# Patient Record
Sex: Female | Born: 1980
Health system: Southern US, Community
[De-identification: ages and names within clinical notes are randomized; demographics above are authoritative.]

## PROBLEM LIST (undated history)

## (undated) DIAGNOSIS — F988 Other specified behavioral and emotional disorders with onset usually occurring in childhood and adolescence: Secondary | ICD-10-CM

## (undated) DIAGNOSIS — R202 Paresthesia of skin: Secondary | ICD-10-CM

## (undated) DIAGNOSIS — E782 Mixed hyperlipidemia: Secondary | ICD-10-CM

## (undated) DIAGNOSIS — K219 Gastro-esophageal reflux disease without esophagitis: Secondary | ICD-10-CM

## (undated) DIAGNOSIS — M5442 Lumbago with sciatica, left side: Secondary | ICD-10-CM

## (undated) HISTORY — PX: WISDOM TOOTH EXTRACTION: SHX21

---
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
## 2000-01-17 ENCOUNTER — Inpatient Hospital Stay (HOSPITAL_COMMUNITY): Admission: AD | Admit: 2000-01-17 | Discharge: 2000-01-17 | Payer: Self-pay | Admitting: *Deleted

## 2000-03-08 ENCOUNTER — Inpatient Hospital Stay (HOSPITAL_COMMUNITY): Admission: AD | Admit: 2000-03-08 | Discharge: 2000-03-08 | Payer: Self-pay | Admitting: Obstetrics & Gynecology

## 2000-03-11 ENCOUNTER — Inpatient Hospital Stay (HOSPITAL_COMMUNITY): Admission: AD | Admit: 2000-03-11 | Discharge: 2000-03-11 | Payer: Self-pay | Admitting: Obstetrics & Gynecology

## 2000-03-18 ENCOUNTER — Inpatient Hospital Stay (HOSPITAL_COMMUNITY): Admission: AD | Admit: 2000-03-18 | Discharge: 2000-03-22 | Payer: Self-pay | Admitting: *Deleted

## 2000-11-23 ENCOUNTER — Encounter: Payer: Self-pay | Admitting: Emergency Medicine

## 2000-11-23 ENCOUNTER — Emergency Department (HOSPITAL_COMMUNITY): Admission: EM | Admit: 2000-11-23 | Discharge: 2000-11-23 | Payer: Self-pay | Admitting: Emergency Medicine

## 2000-11-25 ENCOUNTER — Emergency Department (HOSPITAL_COMMUNITY): Admission: EM | Admit: 2000-11-25 | Discharge: 2000-11-25 | Payer: Self-pay | Admitting: Emergency Medicine

## 2002-02-28 ENCOUNTER — Emergency Department (HOSPITAL_COMMUNITY): Admission: EM | Admit: 2002-02-28 | Discharge: 2002-02-28 | Payer: Self-pay | Admitting: Emergency Medicine

## 2002-02-28 ENCOUNTER — Encounter: Payer: Self-pay | Admitting: Emergency Medicine

## 2002-07-01 ENCOUNTER — Encounter: Admission: RE | Admit: 2002-07-01 | Discharge: 2002-07-01 | Payer: Self-pay | Admitting: *Deleted

## 2002-07-01 ENCOUNTER — Encounter: Payer: Self-pay | Admitting: *Deleted

## 2005-06-13 ENCOUNTER — Other Ambulatory Visit: Admission: RE | Admit: 2005-06-13 | Discharge: 2005-06-13 | Payer: Self-pay | Admitting: Internal Medicine

## 2006-07-05 ENCOUNTER — Other Ambulatory Visit: Admission: RE | Admit: 2006-07-05 | Discharge: 2006-07-05 | Payer: Self-pay | Admitting: *Deleted

## 2007-08-29 ENCOUNTER — Other Ambulatory Visit: Admission: RE | Admit: 2007-08-29 | Discharge: 2007-08-29 | Payer: Self-pay | Admitting: Family Medicine

## 2007-08-29 ENCOUNTER — Encounter: Admission: RE | Admit: 2007-08-29 | Discharge: 2007-08-29 | Payer: Self-pay | Admitting: Family Medicine

## 2009-04-14 ENCOUNTER — Encounter: Admission: RE | Admit: 2009-04-14 | Discharge: 2009-04-14 | Payer: Self-pay | Admitting: Obstetrics and Gynecology

## 2009-05-05 ENCOUNTER — Inpatient Hospital Stay (HOSPITAL_COMMUNITY): Admission: AD | Admit: 2009-05-05 | Discharge: 2009-05-05 | Payer: Self-pay | Admitting: Obstetrics and Gynecology

## 2009-05-08 ENCOUNTER — Inpatient Hospital Stay (HOSPITAL_COMMUNITY): Admission: AD | Admit: 2009-05-08 | Discharge: 2009-05-08 | Payer: Self-pay | Admitting: Obstetrics & Gynecology

## 2009-05-20 ENCOUNTER — Inpatient Hospital Stay (HOSPITAL_COMMUNITY): Admission: AD | Admit: 2009-05-20 | Discharge: 2009-05-21 | Payer: Self-pay | Admitting: Obstetrics and Gynecology

## 2009-05-24 ENCOUNTER — Inpatient Hospital Stay (HOSPITAL_COMMUNITY): Admission: RE | Admit: 2009-05-24 | Discharge: 2009-05-26 | Payer: Self-pay | Admitting: Obstetrics and Gynecology

## 2010-06-09 ENCOUNTER — Other Ambulatory Visit: Admission: RE | Admit: 2010-06-09 | Discharge: 2010-06-09 | Payer: Self-pay | Admitting: Family Medicine

## 2010-10-25 LAB — CBC
HCT: 32.2 % — ABNORMAL LOW (ref 36.0–46.0)
HCT: 36 % (ref 36.0–46.0)
Hemoglobin: 10.8 g/dL — ABNORMAL LOW (ref 12.0–15.0)
Hemoglobin: 12.1 g/dL (ref 12.0–15.0)
MCHC: 33.5 g/dL (ref 30.0–36.0)
MCHC: 33.7 g/dL (ref 30.0–36.0)
MCV: 91 fL (ref 78.0–100.0)
MCV: 91.3 fL (ref 78.0–100.0)
Platelets: 195 10*3/uL (ref 150–400)
Platelets: 238 10*3/uL (ref 150–400)
RBC: 3.52 MIL/uL — ABNORMAL LOW (ref 3.87–5.11)
RBC: 3.96 MIL/uL (ref 3.87–5.11)
RDW: 14.3 % (ref 11.5–15.5)
RDW: 14.6 % (ref 11.5–15.5)
WBC: 13.9 10*3/uL — ABNORMAL HIGH (ref 4.0–10.5)
WBC: 14 10*3/uL — ABNORMAL HIGH (ref 4.0–10.5)

## 2010-10-25 LAB — GLUCOSE, CAPILLARY: Glucose-Capillary: 90 mg/dL (ref 70–99)

## 2010-10-25 LAB — RPR: RPR Ser Ql: NONREACTIVE

## 2010-10-26 LAB — GLUCOSE, CAPILLARY: Glucose-Capillary: 88 mg/dL (ref 70–99)

## 2013-03-09 ENCOUNTER — Other Ambulatory Visit: Payer: Self-pay

## 2013-03-09 DIAGNOSIS — Z1231 Encounter for screening mammogram for malignant neoplasm of breast: Secondary | ICD-10-CM

## 2013-04-20 ENCOUNTER — Ambulatory Visit
Admission: RE | Admit: 2013-04-20 | Discharge: 2013-04-20 | Disposition: A | Discharge status: Home / Self Care | Payer: 59 | Source: Ambulatory Visit

## 2013-04-20 DIAGNOSIS — Z1231 Encounter for screening mammogram for malignant neoplasm of breast: Secondary | ICD-10-CM

## 2013-05-15 ENCOUNTER — Other Ambulatory Visit: Payer: Self-pay | Admitting: Family Medicine

## 2013-05-15 ENCOUNTER — Other Ambulatory Visit (HOSPITAL_COMMUNITY)
Admission: RE | Admit: 2013-05-15 | Discharge: 2013-05-15 | Disposition: A | Discharge status: Home / Self Care | Payer: 59 | Source: Ambulatory Visit | Attending: Family Medicine | Admitting: Family Medicine

## 2013-05-15 DIAGNOSIS — Z0000 Encounter for general adult medical examination without abnormal findings: Secondary | ICD-10-CM | POA: Insufficient documentation

## 2014-12-16 ENCOUNTER — Emergency Department (HOSPITAL_COMMUNITY)
Admission: EM | Admit: 2014-12-16 | Discharge: 2014-12-16 | Disposition: A | Discharge status: Home / Self Care | Payer: 59 | Attending: Emergency Medicine | Admitting: Emergency Medicine

## 2014-12-16 ENCOUNTER — Encounter (HOSPITAL_COMMUNITY): Payer: Self-pay | Admitting: Emergency Medicine

## 2014-12-16 DIAGNOSIS — S91031A Puncture wound without foreign body, right ankle, initial encounter: Secondary | ICD-10-CM | POA: Insufficient documentation

## 2014-12-16 DIAGNOSIS — W228XXA Striking against or struck by other objects, initial encounter: Secondary | ICD-10-CM | POA: Insufficient documentation

## 2014-12-16 DIAGNOSIS — S81812A Laceration without foreign body, left lower leg, initial encounter: Secondary | ICD-10-CM | POA: Diagnosis present

## 2014-12-16 DIAGNOSIS — Y9289 Other specified places as the place of occurrence of the external cause: Secondary | ICD-10-CM | POA: Insufficient documentation

## 2014-12-16 DIAGNOSIS — Y998 Other external cause status: Secondary | ICD-10-CM | POA: Diagnosis not present

## 2014-12-16 DIAGNOSIS — Y9389 Activity, other specified: Secondary | ICD-10-CM | POA: Insufficient documentation

## 2014-12-16 MED ORDER — CEPHALEXIN 500 MG PO CAPS: 500.0000 mg | ORAL_CAPSULE | Freq: Four times a day (QID) | ORAL | Status: DC

## 2014-12-16 NOTE — ED Notes (Signed)
Pt states she was mowing and something flew up and punctured her right lower leg. Very small wound to right anterior shin, bandaged.

## 2014-12-16 NOTE — Discharge Instructions (Signed)
Puncture Wound °A puncture wound is an injury that extends through all layers of the skin and into the tissue beneath the skin (subcutaneous tissue). Puncture wounds become infected easily because germs often enter the body and go beneath the skin during the injury. Having a deep wound with a small entrance point makes it difficult for your caregiver to adequately clean the wound. This is especially true if you have stepped on a nail and it has passed through a dirty shoe or other situations where the wound is obviously contaminated. °CAUSES  °Many puncture wounds involve glass, nails, splinters, fish hooks, or other objects that enter the skin (foreign bodies). A puncture wound may also be caused by a human bite or animal bite. °DIAGNOSIS  °A puncture wound is usually diagnosed by your history and a physical exam. You may need to have an X-ray or an ultrasound to check for any foreign bodies still in the wound. °TREATMENT  °· Your caregiver will clean the wound as thoroughly as possible. Depending on the location of the wound, a bandage (dressing) may be applied. °· Your caregiver might prescribe antibiotic medicines. °· You may need a follow-up visit to check on your wound. Follow all instructions as directed by your caregiver. °HOME CARE INSTRUCTIONS  °· Change your dressing once per day, or as directed by your caregiver. If the dressing sticks, it may be removed by soaking the area in water. °· If your caregiver has given you follow-up instructions, it is very important that you return for a follow-up appointment. Not following up as directed could result in a chronic or permanent injury, pain, and disability. °· Only take over-the-counter or prescription medicines for pain, discomfort, or fever as directed by your caregiver. °· If you are given antibiotics, take them as directed. Finish them even if you start to feel better. °You may need a tetanus shot if: °· You cannot remember when you had your last tetanus  shot. °· You have never had a tetanus shot. °If you got a tetanus shot, your arm may swell, get red, and feel warm to the touch. This is common and not a problem. If you need a tetanus shot and you choose not to have one, there is a rare chance of getting tetanus. Sickness from tetanus can be serious. °You may need a rabies shot if an animal bite caused your puncture wound. °SEEK MEDICAL CARE IF:  °· You have redness, swelling, or increasing pain in the wound. °· You have red streaks going away from the wound. °· You notice a bad smell coming from the wound or dressing. °· You have yellowish-white fluid (pus) coming from the wound. °· You are treated with an antibiotic for infection, but the infection is not getting better. °· You notice something in the wound, such as rubber from your shoe, cloth, or another object. °· You have a fever. °· You have severe pain. °· You have difficulty breathing. °· You feel dizzy or faint. °· You cannot stop vomiting. °· You lose feeling, develop numbness, or cannot move a limb below the wound. °· Your symptoms worsen. °MAKE SURE YOU: °· Understand these instructions. °· Will watch your condition. °· Will get help right away if you are not doing well or get worse. °Document Released: 04/18/2005 Document Revised: 10/01/2011 Document Reviewed: 12/26/2010 °[employer]® Patient Information ©2015 [employer]. This information is not intended to replace advice given to you by your health care provider. Make sure you discuss any questions you   have with your health care provider. ° °

## 2014-12-16 NOTE — ED Provider Notes (Signed)
CSN: [id number]     Arrival date & time 12/16/14  2052 History  This chart was scribed for Sheena Madura, PA-C working with Layla Maw Ward, DO by Elveria Rising, ED Scribe. This patient was seen in room WOTF/NONE and the patient's care was started at 9:14 PM.   Chief Complaint  Patient presents with  . Extremity Laceration   The history is provided by the patient. No language interpreter was used.   HPI Comments: Sheena Dominguez is a 34 y.o. female who presents to the Emergency Department with left lower leg injury incurred one hour ago while cutting the grass tonight. Patient reports being struck by unknown projectile that shot from the mower. Patient suspects it was rock; states she was moving beneath a bush. Patient presents with bleeding puncture wound; patient denies increased bleeding with bearing weight. Patient reports throbbing and aching pain at the site and burning pain with applied pressure.  Patient reports updated Tetanus.   History reviewed. No pertinent past medical history. History reviewed. No pertinent past surgical history. History reviewed. No pertinent family history. History  Substance Use Topics  . Smoking status: Not on file  . Smokeless tobacco: Not on file  . Alcohol Use: Not on file   OB History    No data available      Review of Systems  Constitutional: Negative for fever.  Skin: Positive for wound.  All other systems reviewed and are negative.   Allergies  Review of patient's allergies indicates no known allergies.  Home Medications   Prior to Admission medications   Medication Sig Start Date End Date Taking? Authorizing Provider  cephALEXin (KEFLEX) 500 MG capsule Take 1 capsule (500 mg total) by mouth 4 (four) times daily. 12/16/14   Sheena Madura, PA-C   BP 115/77 mmHg  Pulse 79  SpO2 99%   Physical Exam  Constitutional: She is oriented to person, place, and time. She appears well-developed and well-nourished. No distress.  Nontoxic/nonseptic  appearing  HENT:  Head: Normocephalic and atraumatic.  Eyes: Conjunctivae and EOM are normal. No scleral icterus.  Neck: Normal range of motion.  Cardiovascular: Normal rate, regular rhythm and intact distal pulses.   DP and PT pulses 2+ bilaterally  Pulmonary/Chest: Effort normal. No respiratory distress.  Musculoskeletal: Normal range of motion.       Right ankle: She exhibits normal range of motion, no deformity and normal pulse. No lateral malleolus and no medial malleolus tenderness found. Achilles tendon normal.       Feet:  Neurological: She is alert and oriented to person, place, and time. She exhibits normal muscle tone. Coordination normal.  Skin: Skin is warm and dry. No rash noted. She is not diaphoretic. No pallor.  Psychiatric: She has a normal mood and affect. Her behavior is normal.  Nursing note and vitals reviewed.   ED Course  Procedures (including critical care time)  COORDINATION OF CARE: 9:23 PM- Patient declines imaging due to cost. Discussed treatment plan with patient at bedside and patient agreed to plan.   Labs Review Labs Reviewed - No data to display  Imaging Review No results found.   EKG Interpretation None      MDM   Final diagnoses:  Puncture wound of ankle, right, initial encounter    34 year old female presents to the emergency department for further evaluation of an injury to her lateral right ankle. Patient is neurovascularly intact. She has a puncture wound noted to her right lateral ankle above her lateral  malleolus. Bleeding is controlled. No palpable or visualized foreign body. Have recommended x-ray to evaluate for foreign body which patient declines. She understands the risks of declining this test and has the capacity to make this decision. Given nature of wound, have recommended leaving the wound open and applying a pressure dressing. Wound copiously irrigated with pressurized sterile water and dressed with gauze. Tetanus  up-to-date. Have advised RICE and elevation. Patient placed on Keflex as a precaution. Primary care follow up advised and return precautions given. Patient agreeable to plan with no unaddressed concerns.  I personally performed the services described in this documentation, which was scribed in my presence. The recorded information has been reviewed and is accurate.   Filed Vitals:   12/16/14 2140  BP: 115/77  Pulse: 79  SpO2: 99%      Sheena MaduraKelly Johari Bennetts, PA-C 12/16/14 2159  Layla MawKristen N Ward, DO 12/16/14 2225

## 2014-12-24 ENCOUNTER — Ambulatory Visit
Admission: RE | Admit: 2014-12-24 | Discharge: 2014-12-24 | Disposition: A | Discharge status: Home / Self Care | Payer: 59 | Source: Ambulatory Visit | Attending: Family Medicine | Admitting: Family Medicine

## 2014-12-24 ENCOUNTER — Other Ambulatory Visit: Payer: Self-pay | Admitting: Family Medicine

## 2014-12-24 DIAGNOSIS — M79604 Pain in right leg: Secondary | ICD-10-CM

## 2018-09-12 ENCOUNTER — Other Ambulatory Visit: Payer: Self-pay | Admitting: Family Medicine

## 2018-09-12 DIAGNOSIS — Z1231 Encounter for screening mammogram for malignant neoplasm of breast: Secondary | ICD-10-CM

## 2018-10-10 ENCOUNTER — Inpatient Hospital Stay: Admission: RE | Admit: 2018-10-10 | Payer: 59 | Source: Ambulatory Visit

## 2019-01-19 ENCOUNTER — Other Ambulatory Visit: Payer: Self-pay | Admitting: Family Medicine

## 2019-01-19 ENCOUNTER — Other Ambulatory Visit (HOSPITAL_COMMUNITY)
Admission: RE | Admit: 2019-01-19 | Discharge: 2019-01-19 | Disposition: A | Discharge status: Home / Self Care | Payer: 59 | Source: Ambulatory Visit | Attending: Family Medicine | Admitting: Family Medicine

## 2019-01-19 DIAGNOSIS — Z0000 Encounter for general adult medical examination without abnormal findings: Secondary | ICD-10-CM | POA: Diagnosis present

## 2019-01-21 LAB — CYTOLOGY - PAP: Diagnosis: NEGATIVE

## 2019-02-04 ENCOUNTER — Ambulatory Visit
Admission: RE | Admit: 2019-02-04 | Discharge: 2019-02-04 | Disposition: A | Discharge status: Home / Self Care | Payer: 59 | Source: Ambulatory Visit | Attending: Family Medicine | Admitting: Family Medicine

## 2019-02-04 DIAGNOSIS — Z1231 Encounter for screening mammogram for malignant neoplasm of breast: Secondary | ICD-10-CM

## 2019-02-05 ENCOUNTER — Other Ambulatory Visit: Payer: Self-pay | Admitting: Family Medicine

## 2019-02-05 DIAGNOSIS — R928 Other abnormal and inconclusive findings on diagnostic imaging of breast: Secondary | ICD-10-CM

## 2019-02-18 ENCOUNTER — Ambulatory Visit: Payer: 59

## 2019-02-18 ENCOUNTER — Other Ambulatory Visit: Payer: Self-pay

## 2019-02-18 ENCOUNTER — Ambulatory Visit
Admission: RE | Admit: 2019-02-18 | Discharge: 2019-02-18 | Disposition: A | Discharge status: Home / Self Care | Payer: 59 | Source: Ambulatory Visit | Attending: Family Medicine | Admitting: Family Medicine

## 2019-02-18 DIAGNOSIS — R928 Other abnormal and inconclusive findings on diagnostic imaging of breast: Secondary | ICD-10-CM

## 2019-02-26 ENCOUNTER — Other Ambulatory Visit: Payer: 59

## 2020-06-21 ENCOUNTER — Other Ambulatory Visit: Payer: Self-pay

## 2020-06-21 ENCOUNTER — Emergency Department (HOSPITAL_COMMUNITY)
Admission: EM | Admit: 2020-06-21 | Discharge: 2020-06-21 | Disposition: A | Discharge status: Home / Self Care | Payer: 59 | Attending: Emergency Medicine | Admitting: Emergency Medicine

## 2020-06-21 ENCOUNTER — Encounter (HOSPITAL_COMMUNITY): Payer: Self-pay

## 2020-06-21 DIAGNOSIS — M5432 Sciatica, left side: Secondary | ICD-10-CM

## 2020-06-21 DIAGNOSIS — M545 Low back pain, unspecified: Secondary | ICD-10-CM | POA: Diagnosis present

## 2020-06-21 DIAGNOSIS — F17210 Nicotine dependence, cigarettes, uncomplicated: Secondary | ICD-10-CM | POA: Diagnosis not present

## 2020-06-21 DIAGNOSIS — M5442 Lumbago with sciatica, left side: Secondary | ICD-10-CM | POA: Diagnosis not present

## 2020-06-21 MED ORDER — CYCLOBENZAPRINE HCL 10 MG PO TABS: 10.0000 mg | ORAL_TABLET | Freq: Three times a day (TID) | ORAL | 0 refills | Status: AC | PRN

## 2020-06-21 MED ORDER — KETOROLAC TROMETHAMINE 15 MG/ML IJ SOLN
30.0000 mg | Freq: Once | INTRAMUSCULAR | Status: AC
  Administered 2020-06-21: 30 mg via INTRAMUSCULAR
  Filled 2020-06-21: qty 2

## 2020-06-21 NOTE — ED Provider Notes (Signed)
[facility]-EMERGENCY DEPT Provider Note   CSN: [id number] Arrival date & time: 06/21/20  0719     History Chief Complaint  Patient presents with  . Back Pain    Sheena Dominguez is a 39 y.o. female.  HPI 39 year old female presents with severe low back pain and radiation of the pain to her left leg.  Started with some nonspecific and atraumatic low back pain.  Now it is focal more in the left side and radiating down the posterior aspect of her left leg.  No numbness or weakness in the leg but is very painful to move it.  She tried ibuprofen and Tylenol.  No fevers or incontinence.  No IV drug use.  No trauma.   History reviewed. No pertinent past medical history.  There are no problems to display for this patient.   History reviewed. No pertinent surgical history.   OB History   No obstetric history on file.     Family History  Problem Relation Age of Onset  . Breast cancer Mother 83    Social History   Tobacco Use  . Smoking status: Current Every Day Smoker    Types: Cigarettes  . Smokeless tobacco: Never Used  Vaping Use  . Vaping Use: Never used  Substance Use Topics  . Alcohol use: Yes  . Drug use: Never    Home Medications Prior to Admission medications   Medication Sig Start Date End Date Taking? Authorizing Provider  cephALEXin (KEFLEX) 500 MG capsule Take 1 capsule (500 mg total) by mouth 4 (four) times daily. 12/16/14   Antony Madura, PA-C  cyclobenzaprine (FLEXERIL) 10 MG tablet Take 1 tablet (10 mg total) by mouth 3 (three) times daily as needed for muscle spasms. 06/21/20   Pricilla Loveless, MD    Allergies    Patient has no known allergies.  Review of Systems   Review of Systems  Constitutional: Negative for fever.  Gastrointestinal: Negative for abdominal pain.  Genitourinary:       No incontinence  Musculoskeletal: Positive for back pain.  Neurological: Negative for weakness and numbness.    Physical Exam Updated  Vital Signs BP (!) 155/99 (BP Location: Right Arm)   Pulse 90   Temp 97.7 F (36.5 C) (Oral)   Resp 18   Ht 5' (1.524 m)   Wt 72.6 kg   SpO2 97%   BMI 31.25 kg/m   Physical Exam Vitals and nursing note reviewed.  Constitutional:      Appearance: She is well-developed.  HENT:     Head: Normocephalic and atraumatic.     Right Ear: External ear normal.     Left Ear: External ear normal.     Nose: Nose normal.  Eyes:     General:        Right eye: No discharge.        Left eye: No discharge.  Cardiovascular:     Rate and Rhythm: Normal rate and regular rhythm.     Pulses:          Dorsalis pedis pulses are 2+ on the right side and 2+ on the left side.  Pulmonary:     Effort: Pulmonary effort is normal.  Abdominal:     General: There is no distension.     Palpations: Abdomen is soft.     Tenderness: There is no abdominal tenderness.  Musculoskeletal:     Lumbar back: Tenderness present.  Skin:    General: Skin is warm  and dry.  Neurological:     Mental Status: She is alert.     Deep Tendon Reflexes:     Reflex Scores:      Patellar reflexes are 2+ on the right side and 2+ on the left side.    Comments: 5/5 strength in BLE. Normal gross sensation  Psychiatric:        Mood and Affect: Mood is not anxious.     ED Results / Procedures / Treatments   Labs (all labs ordered are listed, but only abnormal results are displayed) Labs Reviewed - No data to display  EKG None  Radiology No results found.  Procedures Procedures (including critical care time)  Medications Ordered in ED Medications  ketorolac (TORADOL) 15 MG/ML injection 30 mg (30 mg Intramuscular Given 06/21/20 1015)    ED Course  I have reviewed the triage vital signs and the nursing notes.  Pertinent labs & imaging results that were available during my care of the patient were reviewed by me and considered in my medical decision making (see chart for details).    MDM Rules/Calculators/A&P                           Patient appears to have sciatica.  My suspicion of acute spinal cord emergency is pretty low.  I do not think she needs emergent MRI but may need an outpatient if not improving.  Otherwise, will give IM Toradol here.  Will prescribe muscle relaxer in addition to her ibuprofen and Tylenol.  She declined something stronger.  Discharged home with return precautions.  Encouraged to follow-up with PCP and possible physical therapy Final Clinical Impression(s) / ED Diagnoses Final diagnoses:  Sciatica of left side    Rx / DC Orders ED Discharge Orders         Ordered    cyclobenzaprine (FLEXERIL) 10 MG tablet  3 times daily PRN        06/21/20 1013           Pricilla Loveless, MD 06/21/20 1032

## 2020-06-21 NOTE — ED Triage Notes (Signed)
Patient c/o left back pain that radiates into the buttocks and left leg. Patient lying on the floor in triage when writer arrived in the room. Patient sobbing and states pain is worse than labor.

## 2020-06-21 NOTE — Discharge Instructions (Addendum)
If you develop worsening, recurrent, or continued back pain, numbness or weakness in the legs, incontinence of your bowels or bladders, numbness of your buttocks, fever, abdominal pain, or any other new/concerning symptoms then return to the ER for evaluation.   Do not take any other medicines in combination with Flexeril. Do not take with alcohol. You may still take ibuprofen and tylenol.

## 2020-07-11 ENCOUNTER — Other Ambulatory Visit: Payer: Self-pay | Admitting: Orthopedic Surgery

## 2020-07-13 NOTE — Progress Notes (Signed)
[id number] [place], [place] - [address] AT [facility] OF [facility] [address] [place] [address] Phone: [phone] Fax: [phone]      Your procedure is scheduled on Thursday December 30th.  Report to [facility] Main Entrance "A" at 1145 A.M., and check in at the Admitting office.  Call this number if you have problems the morning of surgery:  [phone]  Call [phone] if you have any questions prior to your surgery date Monday-Friday 8am-4pm    Remember:  Do not eat after midnight the night before your surgery  You may drink clear liquids until 1145 the morning of your surgery.   Clear liquids allowed are: Water, Non-Citrus Juices (without pulp), Carbonated Beverages, Clear Tea, Black Coffee Only, and Gatorade   Enhanced Recovery after Surgery for Orthopedics Enhanced Recovery after Surgery is a protocol used to improve the stress on your body and your recovery after surgery.  Patient Instructions  . The night before surgery:  o No food after midnight. ONLY clear liquids after midnight  .  Marland Kitchen The day of surgery (if you do NOT have diabetes):  o Drink ONE (1) Pre-Surgery Clear Ensure by __1145___ am the morning of surgery   o This drink was given to you during your hospital  pre-op appointment visit. o Nothing else to drink after completing the  Pre-Surgery Clear Ensure.         If you have questions, please contact your surgeon's office.    Take these medicines the morning of surgery with A SIP OF WATER IF NEEDED  acetaminophen (TYLENOL) 500 MG tablet   cyclobenzaprine (FLEXERIL) 10 MG tablet  HYDROcodone-acetaminophen (NORCO/VICODIN) 5-325 MG tablet  traMADol (ULTRAM) 50 MG tablet  As of today, STOP taking any Aspirin (unless otherwise instructed by your surgeon) Aleve, Naproxen, Ibuprofen, Motrin, Advil, Goody's, BC's, all herbal medications, fish oil, and all vitamins.                      Do not wear  jewelry, make up, or nail polish            Do not wear lotions, powders, perfumes, or deodorant.            Do not shave 48 hours prior to surgery.              Do not bring valuables to the hospital.            [facility] is not responsible for any belongings or valuables.  Do NOT Smoke (Tobacco/Vaping) or drink Alcohol 24 hours prior to your procedure If you use a CPAP at night, you may bring all equipment for your overnight stay.   Contacts, glasses, dentures or bridgework may not be worn into surgery.      For patients admitted to the hospital, discharge time will be determined by your treatment team.   Patients discharged the day of surgery will not be allowed to drive home, and someone needs to stay with them for 24 hours.    Special instructions:   [place]- Preparing For Surgery  Before surgery, you can play an important role. Because skin is not sterile, your skin needs to be as free of germs as possible. You can reduce the number of germs on your skin by washing with CHG (chlorahexidine gluconate) Soap before surgery.  CHG is an antiseptic cleaner which kills germs and bonds with the skin to  continue killing germs even after washing.    Oral Hygiene is also important to reduce your risk of infection.  Remember - BRUSH YOUR TEETH THE MORNING OF SURGERY WITH YOUR REGULAR TOOTHPASTE  Please do not use if you have an allergy to CHG or antibacterial soaps. If your skin becomes reddened/irritated stop using the CHG.  Do not shave (including legs and underarms) for at least 48 hours prior to first CHG shower. It is OK to shave your face.  Please follow these instructions carefully.   1. Shower the NIGHT BEFORE SURGERY and the MORNING OF SURGERY with CHG Soap.   2. If you chose to wash your hair, wash your hair first as usual with your normal shampoo.  3. After you shampoo, rinse your hair and body thoroughly to remove the shampoo.  4. Use CHG as you would any other liquid  soap. You can apply CHG directly to the skin and wash gently with a scrungie or a clean washcloth.   5. Apply the CHG Soap to your body ONLY FROM THE NECK DOWN.  Do not use on open wounds or open sores. Avoid contact with your eyes, ears, mouth and genitals (private parts). Wash Face and genitals (private parts)  with your normal soap.   6. Wash thoroughly, paying special attention to the area where your surgery will be performed.  7. Thoroughly rinse your body with warm water from the neck down.  8. DO NOT shower/wash with your normal soap after using and rinsing off the CHG Soap.  9. Pat yourself dry with a CLEAN TOWEL.  10. Wear CLEAN PAJAMAS to bed the night before surgery  11. Place CLEAN SHEETS on your bed the night of your first shower and DO NOT SLEEP WITH PETS.   Day of Surgery: Wear Clean/Comfortable clothing the morning of surgery Do not apply any deodorants/lotions.   Remember to brush your teeth WITH YOUR REGULAR TOOTHPASTE.   Please read over the following fact sheets that you were given.

## 2020-07-14 ENCOUNTER — Other Ambulatory Visit: Payer: Self-pay

## 2020-07-14 ENCOUNTER — Encounter (HOSPITAL_COMMUNITY)
Admission: RE | Admit: 2020-07-14 | Discharge: 2020-07-14 | Disposition: A | Discharge status: Home / Self Care | Payer: 59 | Source: Ambulatory Visit | Attending: Orthopedic Surgery | Admitting: Orthopedic Surgery

## 2020-07-14 ENCOUNTER — Encounter (HOSPITAL_COMMUNITY): Payer: Self-pay

## 2020-07-14 DIAGNOSIS — R000 Tachycardia, unspecified: Secondary | ICD-10-CM | POA: Insufficient documentation

## 2020-07-14 DIAGNOSIS — Z01818 Encounter for other preprocedural examination: Secondary | ICD-10-CM | POA: Diagnosis present

## 2020-07-14 HISTORY — DX: Gastro-esophageal reflux disease without esophagitis: K21.9

## 2020-07-14 HISTORY — DX: Lumbago with sciatica, left side: M54.42

## 2020-07-14 HISTORY — DX: Other specified behavioral and emotional disorders with onset usually occurring in childhood and adolescence: F98.8

## 2020-07-14 HISTORY — DX: Mixed hyperlipidemia: E78.2

## 2020-07-14 HISTORY — DX: Paresthesia of skin: R20.2

## 2020-07-14 LAB — CBC WITH DIFFERENTIAL/PLATELET
Abs Immature Granulocytes: 0.02 10*3/uL (ref 0.00–0.07)
Basophils Absolute: 0.1 10*3/uL (ref 0.0–0.1)
Basophils Relative: 1 %
Eosinophils Absolute: 0.2 10*3/uL (ref 0.0–0.5)
Eosinophils Relative: 2 %
HCT: 46 % (ref 36.0–46.0)
Hemoglobin: 15.3 g/dL — ABNORMAL HIGH (ref 12.0–15.0)
Immature Granulocytes: 0 %
Lymphocytes Relative: 31 %
Lymphs Abs: 2.9 10*3/uL (ref 0.7–4.0)
MCH: 31.7 pg (ref 26.0–34.0)
MCHC: 33.3 g/dL (ref 30.0–36.0)
MCV: 95.4 fL (ref 80.0–100.0)
Monocytes Absolute: 0.6 10*3/uL (ref 0.1–1.0)
Monocytes Relative: 6 %
Neutro Abs: 5.7 10*3/uL (ref 1.7–7.7)
Neutrophils Relative %: 60 %
Platelets: 299 10*3/uL (ref 150–400)
RBC: 4.82 MIL/uL (ref 3.87–5.11)
RDW: 13.2 % (ref 11.5–15.5)
WBC: 9.5 10*3/uL (ref 4.0–10.5)
nRBC: 0 % (ref 0.0–0.2)

## 2020-07-14 LAB — URINALYSIS, ROUTINE W REFLEX MICROSCOPIC
Bilirubin Urine: NEGATIVE
Glucose, UA: NEGATIVE mg/dL
Hgb urine dipstick: NEGATIVE
Ketones, ur: NEGATIVE mg/dL
Leukocytes,Ua: NEGATIVE
Nitrite: NEGATIVE
Protein, ur: NEGATIVE mg/dL
Specific Gravity, Urine: 1.032 — ABNORMAL HIGH (ref 1.005–1.030)
pH: 5 (ref 5.0–8.0)

## 2020-07-14 LAB — COMPREHENSIVE METABOLIC PANEL
ALT: 31 U/L (ref 0–44)
AST: 20 U/L (ref 15–41)
Albumin: 3.7 g/dL (ref 3.5–5.0)
Alkaline Phosphatase: 57 U/L (ref 38–126)
Anion gap: 9 (ref 5–15)
BUN: 13 mg/dL (ref 6–20)
CO2: 21 mmol/L — ABNORMAL LOW (ref 22–32)
Calcium: 9 mg/dL (ref 8.9–10.3)
Chloride: 108 mmol/L (ref 98–111)
Creatinine, Ser: 0.87 mg/dL (ref 0.44–1.00)
GFR, Estimated: >60 mL/min (ref >60)
Glucose, Bld: 89 mg/dL (ref 70–99)
Potassium: 3.9 mmol/L (ref 3.5–5.1)
Sodium: 138 mmol/L (ref 135–145)
Total Bilirubin: 0.8 mg/dL (ref 0.3–1.2)
Total Protein: 6.6 g/dL (ref 6.5–8.1)

## 2020-07-14 LAB — TYPE AND SCREEN
ABO/RH(D): O POS
Antibody Screen: NEGATIVE

## 2020-07-14 LAB — SURGICAL PCR SCREEN
MRSA, PCR: NEGATIVE
Staphylococcus aureus: NEGATIVE

## 2020-07-14 LAB — APTT: aPTT: 30 seconds (ref 24–36)

## 2020-07-14 LAB — PROTIME-INR
INR: 1 (ref 0.8–1.2)
Prothrombin Time: 12.6 seconds (ref 11.4–15.2)

## 2020-07-14 NOTE — Anesthesia Preprocedure Evaluation (Addendum)
Anesthesia Evaluation  Patient identified by MRN, date of birth, ID band Patient awake    Reviewed: Allergy & Precautions, NPO status , Patient's Chart, lab work & pertinent test results  History of Anesthesia Complications Negative for: history of anesthetic complications  Airway Mallampati: III  TM Distance: >3 FB Neck ROM: Full    Dental  (+) Dental Advisory Given, Teeth Intact   Pulmonary Current SmokerPatient did not abstain from smoking.,    Pulmonary exam normal        Cardiovascular negative cardio ROS Normal cardiovascular exam     Neuro/Psych  Neuromuscular disease (left hand paresthesia; left sciatica) negative psych ROS   GI/Hepatic Neg liver ROS, GERD  Controlled,  Endo/Other  negative endocrine ROS  Renal/GU negative Renal ROS     Musculoskeletal negative musculoskeletal ROS (+)   Abdominal   Peds  (+) ATTENTION DEFICIT DISORDER WITHOUT HYPERACTIVITY Hematology negative hematology ROS (+)   Anesthesia Other Findings Covid test negative   Reproductive/Obstetrics                           Anesthesia Physical Anesthesia Plan  ASA: II  Anesthesia Plan: General   Post-op Pain Management:    Induction: Intravenous  PONV Risk Score and Plan: 3 and Treatment may vary due to age or medical condition, Ondansetron, Dexamethasone and Midazolam  Airway Management Planned: Oral ETT  Additional Equipment: None  Intra-op Plan:   Post-operative Plan: Extubation in OR  Informed Consent: I have reviewed the patients History and Physical, chart, labs and discussed the procedure including the risks, benefits and alternatives for the proposed anesthesia with the patient or authorized representative who has indicated his/her understanding and acceptance.     Dental advisory given  Plan Discussed with: CRNA and Anesthesiologist  Anesthesia Plan Comments:       Anesthesia  Quick Evaluation

## 2020-07-14 NOTE — Progress Notes (Addendum)
PCP - Dr. Tally Joe Cardiologist - Denies  Chest x-ray - Not indicated EKG - 07/14/20 Stress Test - Denies ECHO - Denies Cardiac Cath - denies  Sleep Study - Denies  DM - Denies  ERAS Protcol - Yes instructions given PRE-SURGERY Ensure   COVID TEST- 07/18/20   Anesthesia review: yes Sinus tach in PAT 150's   Patient denies shortness of breath, fever, cough and chest pain at PAT appointment  Heartrate elevated at the PAT appt initially in the 150's. Patient was in pain and anxious.  EKG obtained and notified our PA [facility].  Vital signs rechecked and heartrate down to 101.    All instructions explained to the patient, with a verbal understanding of the material. Patient agrees to go over the instructions while at home for a better understanding. Patient also instructed to self quarantine after being tested for COVID-19. The opportunity to ask questions was provided.

## 2020-07-14 NOTE — Progress Notes (Signed)
Anesthesia Chart Review:  Case: [id number] Date/Time: 07/21/20 1433   Procedure: LUMBAR 5 - SACRUM 1 DECOMPRESSION (N/A )   Anesthesia type: General   Pre-op diagnosis: SEVERE LEFT-SIDED LUMBARRADICULOPATHY SECONDARY TO A VERY LARGE LUMBAR 5 - SACRUM 1 DISC HERNIATION   Location: [facility]OM 05 / [facility]   Surgeons: Estill Bamberg, MD      DISCUSSION: Patient is a 39 year old female scheduled for the above procedure.  History includes smoking, ADD, GERD, HLD, left hand paresthesia, wisdom teeth extractions.   According to staff, HR was reading 158 on Dinamap on arrival. She denied SOB, chest pain, dizziness. She reported significant back pain after walking from the front of the hospital. She was taken promptly to the EKG room where EKG showed HR of 115 bpm.  At this point, staff proceeded with her PAT interview. Follow-up vitals prior to leaving PAT showed BP 121/98, HR 101.  She said that prior to 06/22/20 she could perform her usual activities but developed severe pain on 06/22/20, and at one point couldn't walk. She has been treated with a Cortisone injection and also took a 9 day steroid taper starting around 06/23/20. Denied known cardiac issues.   She is for preoperative COVID-19 testing on 07/18/2020.  Urine pregnancy test day of surgery.  Anesthesia team evaluation on the day of surgery.   VS: BP (!) 121/98 (BP Location: Right Arm)   Pulse (!) 101   Temp 36.6 C (Oral)   Resp 18   Ht 5\' 3"  (1.6 m)   Wt 74.7 kg   SpO2 98%   BMI 29.18 kg/m    PROVIDERS: , MD is PCP    LABS: Labs reviewed: Acceptable for surgery. (all labs ordered are listed, but only abnormal results are displayed)  Labs Reviewed  CBC WITH DIFFERENTIAL/PLATELET - Abnormal; Notable for the following components:      Result Value   Hemoglobin 15.3 (*)    All other components within normal limits  COMPREHENSIVE METABOLIC PANEL - Abnormal; Notable for the following components:   CO2 21 (*)    All  other components within normal limits  URINALYSIS, ROUTINE W REFLEX MICROSCOPIC - Abnormal; Notable for the following components:   Specific Gravity, Urine 1.032 (*)    All other components within normal limits  SURGICAL PCR SCREEN  PROTIME-INR  APTT  TYPE AND SCREEN    EKG: 07/14/20: ST at 115 bpm   CV: N/A   Past Medical History:  Diagnosis Date  . Acute left-sided low back pain with left-sided sciatica   . ADD (attention deficit disorder)   . GERD (gastroesophageal reflux disease)   . Left hand paresthesia   . Mixed hyperlipidemia     Past Surgical History:  Procedure Laterality Date  . WISDOM TOOTH EXTRACTION Bilateral 2001-2002    MEDICATIONS: . acetaminophen (TYLENOL) 500 MG tablet  . ADDERALL XR 25 MG 24 hr capsule  . cyclobenzaprine (FLEXERIL) 10 MG tablet  . docusate sodium (COLACE) 100 MG capsule  . HYDROcodone-acetaminophen (NORCO/VICODIN) 5-325 MG tablet  . medroxyPROGESTERone (DEPO-PROVERA) 150 MG/ML injection  . traMADol (ULTRAM) 50 MG tablet   No current facility-administered medications for this encounter.    02-09-1988, PA-C Surgical Short Stay/Anesthesiology [facility] Phone [phone] [facility] Phone [phone] 07/14/2020 1:12 PM

## 2020-07-18 ENCOUNTER — Other Ambulatory Visit (HOSPITAL_COMMUNITY)
Admission: RE | Admit: 2020-07-18 | Discharge: 2020-07-18 | Disposition: A | Discharge status: Home / Self Care | Payer: 59 | Source: Ambulatory Visit | Attending: Orthopedic Surgery | Admitting: Orthopedic Surgery

## 2020-07-18 DIAGNOSIS — Z20822 Contact with and (suspected) exposure to covid-19: Secondary | ICD-10-CM | POA: Diagnosis not present

## 2020-07-18 DIAGNOSIS — Z01812 Encounter for preprocedural laboratory examination: Secondary | ICD-10-CM | POA: Insufficient documentation

## 2020-07-18 LAB — SARS CORONAVIRUS 2 (TAT 6-24 HRS): SARS Coronavirus 2: NEGATIVE

## 2020-07-21 ENCOUNTER — Ambulatory Visit (HOSPITAL_COMMUNITY): Payer: 59 | Admitting: Vascular Surgery

## 2020-07-21 ENCOUNTER — Encounter (HOSPITAL_COMMUNITY): Payer: Self-pay | Admitting: Orthopedic Surgery

## 2020-07-21 ENCOUNTER — Ambulatory Visit (HOSPITAL_COMMUNITY)
Admission: RE | Disposition: A | Discharge status: Home / Self Care | Payer: Self-pay | Source: Home / Self Care | Attending: Orthopedic Surgery

## 2020-07-21 ENCOUNTER — Other Ambulatory Visit: Payer: Self-pay

## 2020-07-21 ENCOUNTER — Ambulatory Visit (HOSPITAL_COMMUNITY): Payer: 59 | Admitting: Certified Registered"

## 2020-07-21 ENCOUNTER — Ambulatory Visit (HOSPITAL_COMMUNITY)
Admission: RE | Admit: 2020-07-21 | Discharge: 2020-07-21 | Disposition: A | Discharge status: Home / Self Care | Payer: 59 | Attending: Orthopedic Surgery | Admitting: Orthopedic Surgery

## 2020-07-21 ENCOUNTER — Ambulatory Visit (HOSPITAL_COMMUNITY): Payer: 59

## 2020-07-21 DIAGNOSIS — M5117 Intervertebral disc disorders with radiculopathy, lumbosacral region: Secondary | ICD-10-CM | POA: Insufficient documentation

## 2020-07-21 DIAGNOSIS — Z419 Encounter for procedure for purposes other than remedying health state, unspecified: Secondary | ICD-10-CM

## 2020-07-21 DIAGNOSIS — F17210 Nicotine dependence, cigarettes, uncomplicated: Secondary | ICD-10-CM | POA: Insufficient documentation

## 2020-07-21 DIAGNOSIS — Z793 Long term (current) use of hormonal contraceptives: Secondary | ICD-10-CM | POA: Insufficient documentation

## 2020-07-21 DIAGNOSIS — Z79899 Other long term (current) drug therapy: Secondary | ICD-10-CM | POA: Insufficient documentation

## 2020-07-21 HISTORY — PX: LUMBAR LAMINECTOMY/DECOMPRESSION MICRODISCECTOMY: SHX5026

## 2020-07-21 LAB — POCT PREGNANCY, URINE: Preg Test, Ur: NEGATIVE

## 2020-07-21 SURGERY — LUMBAR LAMINECTOMY/DECOMPRESSION MICRODISCECTOMY
Anesthesia: General | Site: Back

## 2020-07-21 MED ORDER — LIDOCAINE 2% (20 MG/ML) 5 ML SYRINGE
INTRAMUSCULAR | Status: AC
  Filled 2020-07-21: qty 5

## 2020-07-21 MED ORDER — OXYCODONE HCL 5 MG/5ML PO SOLN: 5.0000 mg | Freq: Once | ORAL | Status: DC | PRN

## 2020-07-21 MED ORDER — POVIDONE-IODINE 7.5 % EX SOLN: Freq: Once | CUTANEOUS | Status: DC

## 2020-07-21 MED ORDER — OXYCODONE-ACETAMINOPHEN 5-325 MG PO TABS: 1.0000 | ORAL_TABLET | ORAL | 0 refills | Status: AC | PRN

## 2020-07-21 MED ORDER — THROMBIN 20000 UNITS EX KIT
PACK | CUTANEOUS | Status: AC
  Filled 2020-07-21: qty 1

## 2020-07-21 MED ORDER — 0.9 % SODIUM CHLORIDE (POUR BTL) OPTIME
TOPICAL | Status: DC | PRN
  Administered 2020-07-21: 1000 mL

## 2020-07-21 MED ORDER — FENTANYL CITRATE (PF) 100 MCG/2ML IJ SOLN
25.0000 ug | INTRAMUSCULAR | Status: DC | PRN
Start: 2020-07-21 — End: 2020-07-21

## 2020-07-21 MED ORDER — PHENYLEPHRINE HCL-NACL 10-0.9 MG/250ML-% IV SOLN
INTRAVENOUS | Status: DC | PRN
  Administered 2020-07-21: 50 ug/min via INTRAVENOUS

## 2020-07-21 MED ORDER — PHENYLEPHRINE 40 MCG/ML (10ML) SYRINGE FOR IV PUSH (FOR BLOOD PRESSURE SUPPORT)
PREFILLED_SYRINGE | INTRAVENOUS | Status: AC
  Filled 2020-07-21: qty 20

## 2020-07-21 MED ORDER — DEXMEDETOMIDINE (PRECEDEX) IN NS 20 MCG/5ML (4 MCG/ML) IV SYRINGE
PREFILLED_SYRINGE | INTRAVENOUS | Status: DC | PRN
  Administered 2020-07-21: 8 ug via INTRAVENOUS

## 2020-07-21 MED ORDER — CHLORHEXIDINE GLUCONATE 0.12 % MT SOLN
15.0000 mL | Freq: Once | OROMUCOSAL | Status: AC
  Administered 2020-07-21: 10:00:00 15 mL via OROMUCOSAL
  Filled 2020-07-21: qty 15

## 2020-07-21 MED ORDER — CEFAZOLIN SODIUM-DEXTROSE 2-4 GM/100ML-% IV SOLN
2.0000 g | INTRAVENOUS | Status: AC
  Administered 2020-07-21: 2 g via INTRAVENOUS
  Filled 2020-07-21: qty 100

## 2020-07-21 MED ORDER — HEMOSTATIC AGENTS (NO CHARGE) OPTIME
TOPICAL | Status: DC | PRN
  Administered 2020-07-21: 1 via TOPICAL

## 2020-07-21 MED ORDER — OXYCODONE HCL 5 MG PO TABS: 5.0000 mg | ORAL_TABLET | Freq: Once | ORAL | Status: DC | PRN

## 2020-07-21 MED ORDER — METHYLPREDNISOLONE ACETATE 40 MG/ML IJ SUSP
INTRAMUSCULAR | Status: AC
  Filled 2020-07-21: qty 1

## 2020-07-21 MED ORDER — MIDAZOLAM HCL 2 MG/2ML IJ SOLN
INTRAMUSCULAR | Status: AC
  Filled 2020-07-21: qty 2

## 2020-07-21 MED ORDER — FENTANYL CITRATE (PF) 250 MCG/5ML IJ SOLN
INTRAMUSCULAR | Status: AC
  Filled 2020-07-21: qty 5

## 2020-07-21 MED ORDER — MIDAZOLAM HCL 2 MG/2ML IJ SOLN
INTRAMUSCULAR | Status: DC | PRN
  Administered 2020-07-21: 2 mg via INTRAVENOUS

## 2020-07-21 MED ORDER — SUGAMMADEX SODIUM 200 MG/2ML IV SOLN
INTRAVENOUS | Status: DC | PRN
  Administered 2020-07-21: 200 mg via INTRAVENOUS
  Administered 2020-07-21 (×2): 100 mg via INTRAVENOUS

## 2020-07-21 MED ORDER — ROCURONIUM BROMIDE 10 MG/ML (PF) SYRINGE
PREFILLED_SYRINGE | INTRAVENOUS | Status: DC | PRN
  Administered 2020-07-21: 30 mg via INTRAVENOUS
  Administered 2020-07-21: 100 mg via INTRAVENOUS

## 2020-07-21 MED ORDER — ONDANSETRON HCL 4 MG/2ML IJ SOLN
INTRAMUSCULAR | Status: DC | PRN
  Administered 2020-07-21: 4 mg via INTRAVENOUS

## 2020-07-21 MED ORDER — FENTANYL CITRATE (PF) 250 MCG/5ML IJ SOLN
INTRAMUSCULAR | Status: DC | PRN
  Administered 2020-07-21: 100 ug via INTRAVENOUS
  Administered 2020-07-21 (×5): 50 ug via INTRAVENOUS

## 2020-07-21 MED ORDER — PROPOFOL 10 MG/ML IV BOLUS
INTRAVENOUS | Status: DC | PRN
  Administered 2020-07-21: 150 mg via INTRAVENOUS

## 2020-07-21 MED ORDER — BUPIVACAINE-EPINEPHRINE 0.25% -1:200000 IJ SOLN
INTRAMUSCULAR | Status: DC | PRN
  Administered 2020-07-21: 10 mL
  Administered 2020-07-21: 5 mL

## 2020-07-21 MED ORDER — ONDANSETRON HCL 4 MG/2ML IJ SOLN
INTRAMUSCULAR | Status: AC
  Filled 2020-07-21: qty 2

## 2020-07-21 MED ORDER — ACETAMINOPHEN 10 MG/ML IV SOLN
INTRAVENOUS | Status: DC | PRN
  Administered 2020-07-21: 1000 mg via INTRAVENOUS

## 2020-07-21 MED ORDER — METHYLPREDNISOLONE ACETATE 40 MG/ML IJ SUSP
INTRAMUSCULAR | Status: DC | PRN
  Administered 2020-07-21: 40 mg

## 2020-07-21 MED ORDER — EPHEDRINE 5 MG/ML INJ
INTRAVENOUS | Status: AC
  Filled 2020-07-21: qty 10

## 2020-07-21 MED ORDER — FENTANYL CITRATE (PF) 100 MCG/2ML IJ SOLN
INTRAMUSCULAR | Status: AC
  Administered 2020-07-21: 50 ug via INTRAVENOUS
  Filled 2020-07-21: qty 2

## 2020-07-21 MED ORDER — ROCURONIUM BROMIDE 10 MG/ML (PF) SYRINGE
PREFILLED_SYRINGE | INTRAVENOUS | Status: AC
  Filled 2020-07-21: qty 10

## 2020-07-21 MED ORDER — LACTATED RINGERS IV SOLN: INTRAVENOUS | Status: DC

## 2020-07-21 MED ORDER — KETAMINE HCL 50 MG/ML IJ SOLN
INTRAMUSCULAR | Status: DC | PRN
  Administered 2020-07-21: 30 mg via INTRAMUSCULAR
  Administered 2020-07-21: 10 mg via INTRAMUSCULAR

## 2020-07-21 MED ORDER — LIDOCAINE 2% (20 MG/ML) 5 ML SYRINGE
INTRAMUSCULAR | Status: DC | PRN
  Administered 2020-07-21: 40 mg via INTRAVENOUS

## 2020-07-21 MED ORDER — THROMBIN 20000 UNITS EX SOLR
CUTANEOUS | Status: DC | PRN
  Administered 2020-07-21: 20 mL via TOPICAL

## 2020-07-21 MED ORDER — PROMETHAZINE HCL 25 MG/ML IJ SOLN: 6.2500 mg | INTRAMUSCULAR | Status: DC | PRN

## 2020-07-21 MED ORDER — METHYLENE BLUE 0.5 % INJ SOLN
INTRAVENOUS | Status: AC
  Filled 2020-07-21: qty 10

## 2020-07-21 MED ORDER — ROCURONIUM BROMIDE 10 MG/ML (PF) SYRINGE
PREFILLED_SYRINGE | INTRAVENOUS | Status: AC
  Filled 2020-07-21: qty 20

## 2020-07-21 MED ORDER — EPHEDRINE SULFATE-NACL 50-0.9 MG/10ML-% IV SOSY
PREFILLED_SYRINGE | INTRAVENOUS | Status: DC | PRN
  Administered 2020-07-21: 10 mg via INTRAVENOUS

## 2020-07-21 MED ORDER — BUPIVACAINE LIPOSOME 1.3 % IJ SUSP
INTRAMUSCULAR | Status: DC | PRN
  Administered 2020-07-21: 5 mL

## 2020-07-21 MED ORDER — BUPIVACAINE-EPINEPHRINE (PF) 0.25% -1:200000 IJ SOLN
INTRAMUSCULAR | Status: AC
  Filled 2020-07-21: qty 30

## 2020-07-21 MED ORDER — ACETAMINOPHEN 10 MG/ML IV SOLN
INTRAVENOUS | Status: AC
  Filled 2020-07-21: qty 100

## 2020-07-21 MED ORDER — PHENYLEPHRINE 40 MCG/ML (10ML) SYRINGE FOR IV PUSH (FOR BLOOD PRESSURE SUPPORT)
PREFILLED_SYRINGE | INTRAVENOUS | Status: DC | PRN
  Administered 2020-07-21 (×4): 120 ug via INTRAVENOUS

## 2020-07-21 MED ORDER — ORAL CARE MOUTH RINSE: 15.0000 mL | Freq: Once | OROMUCOSAL | Status: AC

## 2020-07-21 MED ORDER — BUPIVACAINE LIPOSOME 1.3 % IJ SUSP
20.0000 mL | INTRAMUSCULAR | Status: DC
  Filled 2020-07-21: qty 20

## 2020-07-21 SURGICAL SUPPLY — 73 items
AGENT HMST KT MTR STRL THRMB (HEMOSTASIS) ×1
APL SKNCLS STERI-STRIP NONHPOA (GAUZE/BANDAGES/DRESSINGS) ×1
BENZOIN TINCTURE PRP APPL 2/3 (GAUZE/BANDAGES/DRESSINGS) ×1 IMPLANT
BNDG GAUZE ELAST 4 BULKY (GAUZE/BANDAGES/DRESSINGS) ×1 IMPLANT
BUR ROUND PRECISION 4.0 (BURR) ×2 IMPLANT
CABLE BIPOLOR RESECTION CORD (MISCELLANEOUS) ×2 IMPLANT
CANISTER SUCT 3000ML PPV (MISCELLANEOUS) ×2 IMPLANT
CARTRIDGE OIL MAESTRO DRILL (MISCELLANEOUS) ×1 IMPLANT
COVER SURGICAL LIGHT HANDLE (MISCELLANEOUS) ×2 IMPLANT
DIFFUSER DRILL AIR PNEUMATIC (MISCELLANEOUS) ×2 IMPLANT
DRAIN CHANNEL 15F RND FF W/TCR (WOUND CARE) IMPLANT
DRAPE POUCH INSTRU U-SHP 10X18 (DRAPES) ×4 IMPLANT
DRAPE SURG 17X23 STRL (DRAPES) ×8 IMPLANT
DURAPREP 26ML APPLICATOR (WOUND CARE) ×2 IMPLANT
ELECT BLADE 4.0 EZ CLEAN MEGAD (MISCELLANEOUS) ×2
ELECT CAUTERY BLADE 6.4 (BLADE) ×2 IMPLANT
ELECT REM PT RETURN 9FT ADLT (ELECTROSURGICAL) ×2
ELECTRODE BLDE 4.0 EZ CLN MEGD (MISCELLANEOUS) ×1 IMPLANT
ELECTRODE REM PT RTRN 9FT ADLT (ELECTROSURGICAL) ×1 IMPLANT
EVACUATOR SILICONE 100CC (DRAIN) IMPLANT
FILTER STRAW FLUID ASPIR (MISCELLANEOUS) ×2 IMPLANT
GAUZE 4X4 16PLY RFD (DISPOSABLE) ×4 IMPLANT
GAUZE SPONGE 4X4 12PLY STRL (GAUZE/BANDAGES/DRESSINGS) ×2 IMPLANT
GAUZE SPONGE 4X4 12PLY STRL LF (GAUZE/BANDAGES/DRESSINGS) ×1 IMPLANT
GLOVE BIO SURGEON STRL SZ7 (GLOVE) ×5 IMPLANT
GLOVE BIO SURGEON STRL SZ8 (GLOVE) ×2 IMPLANT
GLOVE BIOGEL PI IND STRL 7.0 (GLOVE) ×1 IMPLANT
GLOVE BIOGEL PI IND STRL 8 (GLOVE) ×1 IMPLANT
GLOVE BIOGEL PI INDICATOR 7.0 (GLOVE) ×3
GLOVE BIOGEL PI INDICATOR 8 (GLOVE) ×1
GOWN STRL REUS W/ TWL LRG LVL3 (GOWN DISPOSABLE) ×1 IMPLANT
GOWN STRL REUS W/ TWL XL LVL3 (GOWN DISPOSABLE) ×2 IMPLANT
GOWN STRL REUS W/TWL LRG LVL3 (GOWN DISPOSABLE) ×2
GOWN STRL REUS W/TWL XL LVL3 (GOWN DISPOSABLE) ×4
IV CATH 14GX2 1/4 (CATHETERS) ×2 IMPLANT
KIT BASIN OR (CUSTOM PROCEDURE TRAY) ×2 IMPLANT
KIT POSITION SURG JACKSON T1 (MISCELLANEOUS) ×2 IMPLANT
KIT TURNOVER KIT B (KITS) ×2 IMPLANT
MARKER SKIN DUAL TIP RULER LAB (MISCELLANEOUS) ×2 IMPLANT
NDL 18GX1X1/2 (RX/OR ONLY) (NEEDLE) ×1 IMPLANT
NDL HYPO 25GX1X1/2 BEV (NEEDLE) ×1 IMPLANT
NDL SPNL 18GX3.5 QUINCKE PK (NEEDLE) ×2 IMPLANT
NEEDLE 18GX1X1/2 (RX/OR ONLY) (NEEDLE) ×2 IMPLANT
NEEDLE 22X1 1/2 (OR ONLY) (NEEDLE) ×2 IMPLANT
NEEDLE HYPO 25GX1X1/2 BEV (NEEDLE) ×2 IMPLANT
NEEDLE SPNL 18GX3.5 QUINCKE PK (NEEDLE) ×4 IMPLANT
NS IRRIG 1000ML POUR BTL (IV SOLUTION) ×2 IMPLANT
OIL CARTRIDGE MAESTRO DRILL (MISCELLANEOUS) ×2
PACK LAMINECTOMY ORTHO (CUSTOM PROCEDURE TRAY) ×2 IMPLANT
PACK UNIVERSAL I (CUSTOM PROCEDURE TRAY) ×2 IMPLANT
PAD ARMBOARD 7.5X6 YLW CONV (MISCELLANEOUS) ×4 IMPLANT
PATTIES SURGICAL .5 X.5 (GAUZE/BANDAGES/DRESSINGS) IMPLANT
PATTIES SURGICAL .5 X1 (DISPOSABLE) ×2 IMPLANT
SPONGE INTESTINAL PEANUT (DISPOSABLE) ×3 IMPLANT
SPONGE SURGIFOAM ABS GEL SZ50 (HEMOSTASIS) ×2 IMPLANT
STRIP CLOSURE SKIN 1/2X4 (GAUZE/BANDAGES/DRESSINGS) ×1 IMPLANT
SURGIFLO W/THROMBIN 8M KIT (HEMOSTASIS) ×1 IMPLANT
SUT MNCRL AB 4-0 PS2 18 (SUTURE) ×2 IMPLANT
SUT VIC AB 0 CT1 18XCR BRD 8 (SUTURE) IMPLANT
SUT VIC AB 0 CT1 8-18 (SUTURE)
SUT VIC AB 1 CT1 18XCR BRD 8 (SUTURE) ×1 IMPLANT
SUT VIC AB 1 CT1 8-18 (SUTURE) ×2
SUT VIC AB 2-0 CT2 18 VCP726D (SUTURE) ×2 IMPLANT
SYR 20ML LL LF (SYRINGE) ×3 IMPLANT
SYR BULB IRRIG 60ML STRL (SYRINGE) ×2 IMPLANT
SYR CONTROL 10ML LL (SYRINGE) ×4 IMPLANT
SYR TB 1ML 25GX5/8 (SYRINGE) ×4 IMPLANT
SYR TB 1ML LUER SLIP (SYRINGE) ×4 IMPLANT
TAPE CLOTH SURG 4X10 WHT LF (GAUZE/BANDAGES/DRESSINGS) ×1 IMPLANT
TOWEL GREEN STERILE (TOWEL DISPOSABLE) ×2 IMPLANT
TOWEL GREEN STERILE FF (TOWEL DISPOSABLE) ×2 IMPLANT
WATER STERILE IRR 1000ML POUR (IV SOLUTION) ×2 IMPLANT
YANKAUER SUCT BULB TIP NO VENT (SUCTIONS) ×2 IMPLANT

## 2020-07-21 NOTE — Anesthesia Procedure Notes (Signed)
Procedure Name: Intubation Date/Time: 07/21/2020 12:19 PM Performed by: Rande Brunt, CRNA Pre-anesthesia Checklist: Patient identified, Emergency Drugs available, Suction available and Patient being monitored Patient Re-evaluated:Patient Re-evaluated prior to induction Oxygen Delivery Method: Circle System Utilized Preoxygenation: Pre-oxygenation with 100% oxygen Induction Type: IV induction Ventilation: Mask ventilation without difficulty Laryngoscope Size: Mac and 4 Grade View: Grade I Tube type: Oral Tube size: 7.0 mm Number of attempts: 1 Airway Equipment and Method: Stylet and Oral airway Placement Confirmation: ETT inserted through vocal cords under direct vision,  positive ETCO2 and breath sounds checked- equal and bilateral Secured at: 23 cm Tube secured with: Tape Dental Injury: Teeth and Oropharynx as per pre-operative assessment

## 2020-07-21 NOTE — Op Note (Signed)
PATIENT NAME: Sheena Dominguez   MEDICAL RECORD NO.:   [id number]   DATE OF BIRTH: 29-Sep-1980   DATE OF PROCEDURE: 07/21/2020                               OPERATIVE REPORT   PREOPERATIVE DIAGNOSES: 1.  Severe left-sided lumbar radiculopathy 2.  Very large L5-S1 disc herniation, severely compressing the left lateral recess and spinal canal  POSTOPERATIVE DIAGNOSES: 1.  Severe left-sided lumbar radiculopathy 2.  Very large L5-S1 disc herniation, severely compressing the left lateral recess and spinal canal  PROCEDURE:  L5/S1 laminectomy with left-sided partial facetectomy and removal of very large L5-S1 disc herniation compressing the L5-S1 spinal canal  SURGEON:  Estill Bamberg, MD.  ASSISTANT: None  ANESTHESIA:  General endotracheal anesthesia.  COMPLICATIONS:  None.  DISPOSITION:  Stable.  ESTIMATED BLOOD LOSS:  Minimal.  INDICATIONS FOR SURGERY:  Briefly, Sheena Dominguez is a very pleasant 39- year-old female, who did present to me with severe pain in her left leg. The patient's MRI did reveal a very large L5/S1 disc hernation.  Given the patient's ongoing pain and dysfunction, we did discuss proceeding with the procedure reflected above.  The patient was fully aware of the risks and limitations of surgery and did wish to proceed.  OPERATIVE DETAILS:  On 07/21/2020, the patient was brought to surgery and general endotracheal anesthesia was administered.  The patient was placed prone on a well-padded flat Jackson bed with a spinal frame. Antibiotics were given and the back was prepped and draped in the usual sterile fashion.  A time-out procedure was performed.  I then made a midline incision overlying the L5/S1 intervertebral space.  The fascia was incised to the left of the midline.  The left paraspinal musculature was bluntly retracted laterally and held retracted with a self-retaining retractor. After confirming the appropriate operative level, I performed a partial  facetectomy on the left at L5/S1. The inferior aspect of L5 was removed centrally and slightly to the right as well, given the large size of the herniation noted on the patient's preoperative MRI. Once the ligamentum flavum was removed, there was clearly noted to be very severe compression of the spinal canal.   The left S1 nerve and dura was located at the midline of the spinal canal, clearly significantly displaced by a large herniated disc fragment. I did use a micronerve hook to tease away the superficial layers overlying the herniated disc fragment.  At this point, 3 very large herniated disc fragments were removed from the spinal canal.  The dura and left S1 nerve did return to their normal anatomic positions.  I did explore the lateral recess and spinal canal at this point, with safe gentle medial retraction of the left S1 nerve, and there were no additional disc fragments or regions of neurologic compression noted. The spinal canal was entirely decompressed.  All bleeding was then adequately controlled using Surgi-Flo.  At this point, 40 mg of Depo-Medrol was introduced about the epidural space.  Prior to this, the wound was copiously irrigated with a total of approximately 2 L of normal saline. Gelfoam was placed over the laminectomy site.  I was very pleased with the decompression.  There was no extravasation of cerebrospinal fluid noted throughout the entire surgery.  At this point, the wound was closed in layers using #1 Vicryl, followed by 2-0 Vicryl, followed by 4- 0 Monocryl.  Benzoin and Steri-Strips  were applied, followed by a sterile dressing.  All instrument counts were correct at the termination of the procedure.    Estill Bamberg, MD

## 2020-07-21 NOTE — Transfer of Care (Signed)
Immediate Anesthesia Transfer of Care Note  Patient: Sheena Dominguez  Procedure(s) Performed: LUMBAR 5 - SACRUM 1 DECOMPRESSION (Back)  Patient Location: PACU  Anesthesia Type:General  Level of Consciousness: awake, alert  and patient cooperative  Airway & Oxygen Therapy: Patient Spontanous Breathing  Post-op Assessment: Report given to RN, Post -op Vital signs reviewed and stable and Patient moving all extremities  Post vital signs: Reviewed and stable  Last Vitals:  Vitals Value Taken Time  BP 119/87 07/21/20 1417  Temp    Pulse 91 07/21/20 1419  Resp 18 07/21/20 1419  SpO2 99 % 07/21/20 1419  Vitals shown include unvalidated device data.  Last Pain:  Vitals:   07/21/20 0940  TempSrc:   PainSc: 0-No pain         Complications: No complications documented.

## 2020-07-21 NOTE — H&P (Signed)
PREOPERATIVE H&P  Chief Complaint: Left leg pain  HPI: Sheena Dominguez is a 39 y.o. female who presents with ongoing pain in the left leg  MRI reveals a very large left L5/S1 disc herniation, compressing the left S1 nerve and spinal canal  Patient has failed multiple forms of conservative care and continues to have pain (see office notes for additional details regarding the patient's full course of treatment)  Past Medical History:  Diagnosis Date  . Acute left-sided low back pain with left-sided sciatica   . ADD (attention deficit disorder)   . GERD (gastroesophageal reflux disease)   . Left hand paresthesia   . Mixed hyperlipidemia    Past Surgical History:  Procedure Laterality Date  . WISDOM TOOTH EXTRACTION Bilateral 2001-2002   Social History   Socioeconomic History  . Marital status: Single    Spouse name: Not on file  . Number of children: Not on file  . Years of education: Not on file  . Highest education level: Not on file  Occupational History  . Not on file  Tobacco Use  . Smoking status: Current Every Day Smoker    Types: Cigarettes  . Smokeless tobacco: Never Used  Vaping Use  . Vaping Use: Never used  Substance and Sexual Activity  . Alcohol use: Not Currently  . Drug use: Never  . Sexual activity: Yes  Other Topics Concern  . Not on file  Social History Narrative  . Not on file   Social Determinants of Health   Financial Resource Strain: Not on file  Food Insecurity: Not on file  Transportation Needs: Not on file  Physical Activity: Not on file  Stress: Not on file  Social Connections: Not on file   Family History  Problem Relation Age of Onset  . Breast cancer Mother 9   No Known Allergies Prior to Admission medications   Medication Sig Start Date End Date Taking? Authorizing Provider  acetaminophen (TYLENOL) 500 MG tablet Take 1,000 mg by mouth every 6 (six) hours as needed for moderate pain.   Yes [provider]   ADDERALL XR 25 MG 24 hr capsule Take 25 mg by mouth daily as needed (work). 06/22/20  Yes [provider]  cyclobenzaprine (FLEXERIL) 10 MG tablet Take 1 tablet (10 mg total) by mouth 3 (three) times daily as needed for muscle spasms. 06/21/20  Yes Pricilla Loveless, MD  docusate sodium (COLACE) 100 MG capsule Take 200 mg by mouth daily as needed for mild constipation.   Yes [provider]  HYDROcodone-acetaminophen (NORCO/VICODIN) 5-325 MG tablet Take 1 tablet by mouth every 6 (six) hours as needed for pain. 07/11/20  Yes [provider]  medroxyPROGESTERone (DEPO-PROVERA) 150 MG/ML injection Inject 150 mg into the muscle See admin instructions. Every 12 weeks 06/29/20  Yes [provider]  traMADol (ULTRAM) 50 MG tablet Take 50 mg by mouth at bedtime as needed for pain. 06/28/20  Yes [provider]     All other systems have been reviewed and were otherwise negative with the exception of those mentioned in the HPI and as above.  Physical Exam: Vitals:   07/21/20 0912  BP: (!) 141/99  Pulse: (!) 101  Resp: 18  Temp: 98.1 F (36.7 C)  SpO2: 99%    Body mass index is 28.34 kg/m.  General: Alert, no acute distress Cardiovascular: No pedal edema Respiratory: No cyanosis, no use of accessory musculature Skin: No lesions in the area of chief  complaint Neurologic: Sensation intact distally Psychiatric: Patient is competent for consent with normal mood and affect Lymphatic: No axillary or cervical lymphadenopathy  MUSCULOSKELETAL: + SLR on the left  Assessment/Plan: SEVERE LEFT-SIDED LUMBARRADICULOPATHY SECONDARY TO A VERY LARGE LUMBAR 5 - SACRUM 1 DISC HERNIATION Plan for Procedure(s): LUMBAR 5 - SACRUM 1 DECOMPRESSION   Jackelyn Hoehn, MD 07/21/2020 10:08 AM

## 2020-07-21 NOTE — Anesthesia Postprocedure Evaluation (Signed)
Anesthesia Post Note  Patient: Sheena Dominguez  Procedure(s) Performed: LUMBAR 5 - SACRUM 1 DECOMPRESSION (Back)     Patient location during evaluation: PACU Anesthesia Type: General Level of consciousness: awake and alert Pain management: pain level controlled Vital Signs Assessment: post-procedure vital signs reviewed and stable Respiratory status: spontaneous breathing, nonlabored ventilation and respiratory function stable Cardiovascular status: blood pressure returned to baseline and stable Postop Assessment: no apparent nausea or vomiting Anesthetic complications: no   No complications documented.  Last Vitals:  Vitals:   07/21/20 1522 07/21/20 1539  BP: (!) 130/94 (!) 127/97  Pulse: 81   Resp: (!) 23 15  Temp:  36.6 C  SpO2: 98% 100%    Last Pain:  Vitals:   07/21/20 1447  TempSrc:   PainSc: 0-No pain                 Beryle Lathe

## 2020-07-22 ENCOUNTER — Encounter (HOSPITAL_COMMUNITY): Payer: Self-pay | Admitting: Orthopedic Surgery

## 2020-07-22 LAB — ABO/RH: ABO/RH(D): O POS

## 2020-07-22 MED FILL — Thrombin For Soln Kit 20000 Unit: CUTANEOUS | Qty: 1 | Status: AC

## 2022-06-03 IMAGING — CR DG LUMBAR SPINE 2-3V
2 series · 2 of 2 positions shown · non-contrast
Comparison: 06/28/2020

CLINICAL DATA: L5-S1 decompression

EXAM:
LUMBAR SPINE - 2-3 VIEW

[lateral (1 of 2)]
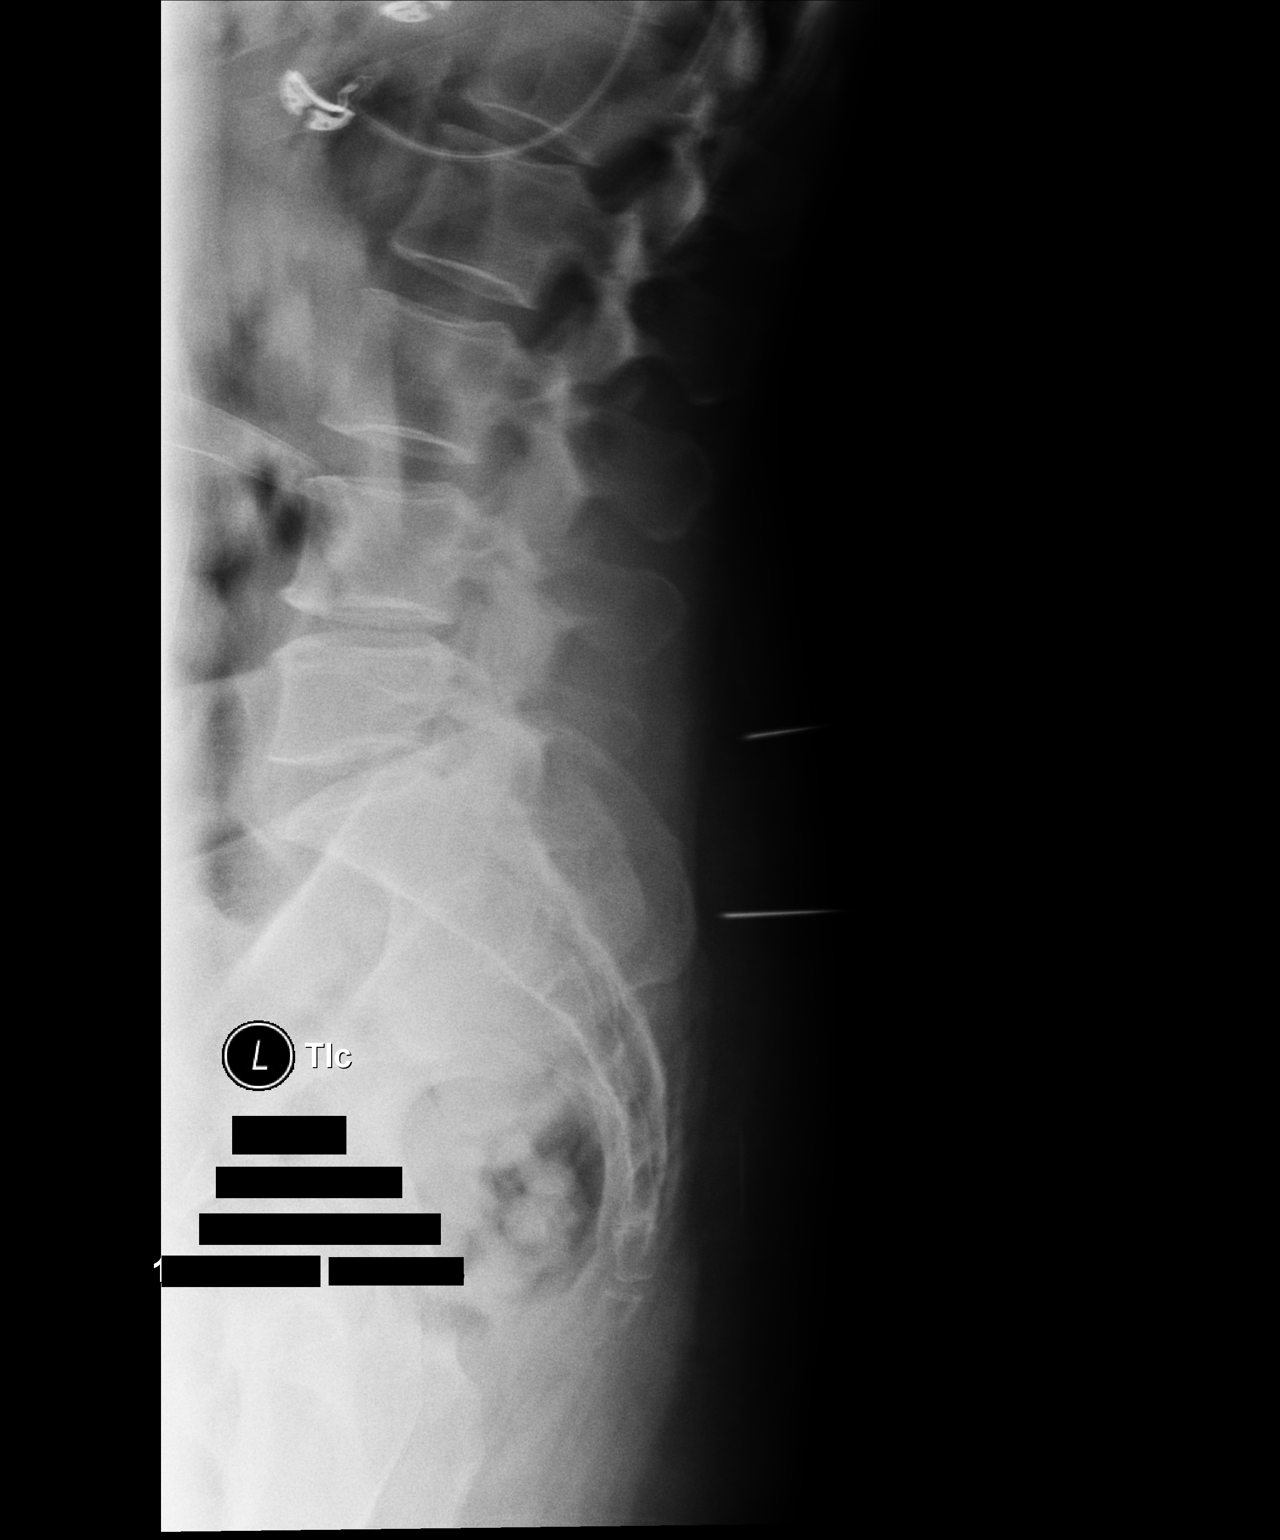

[lateral (2 of 2)]
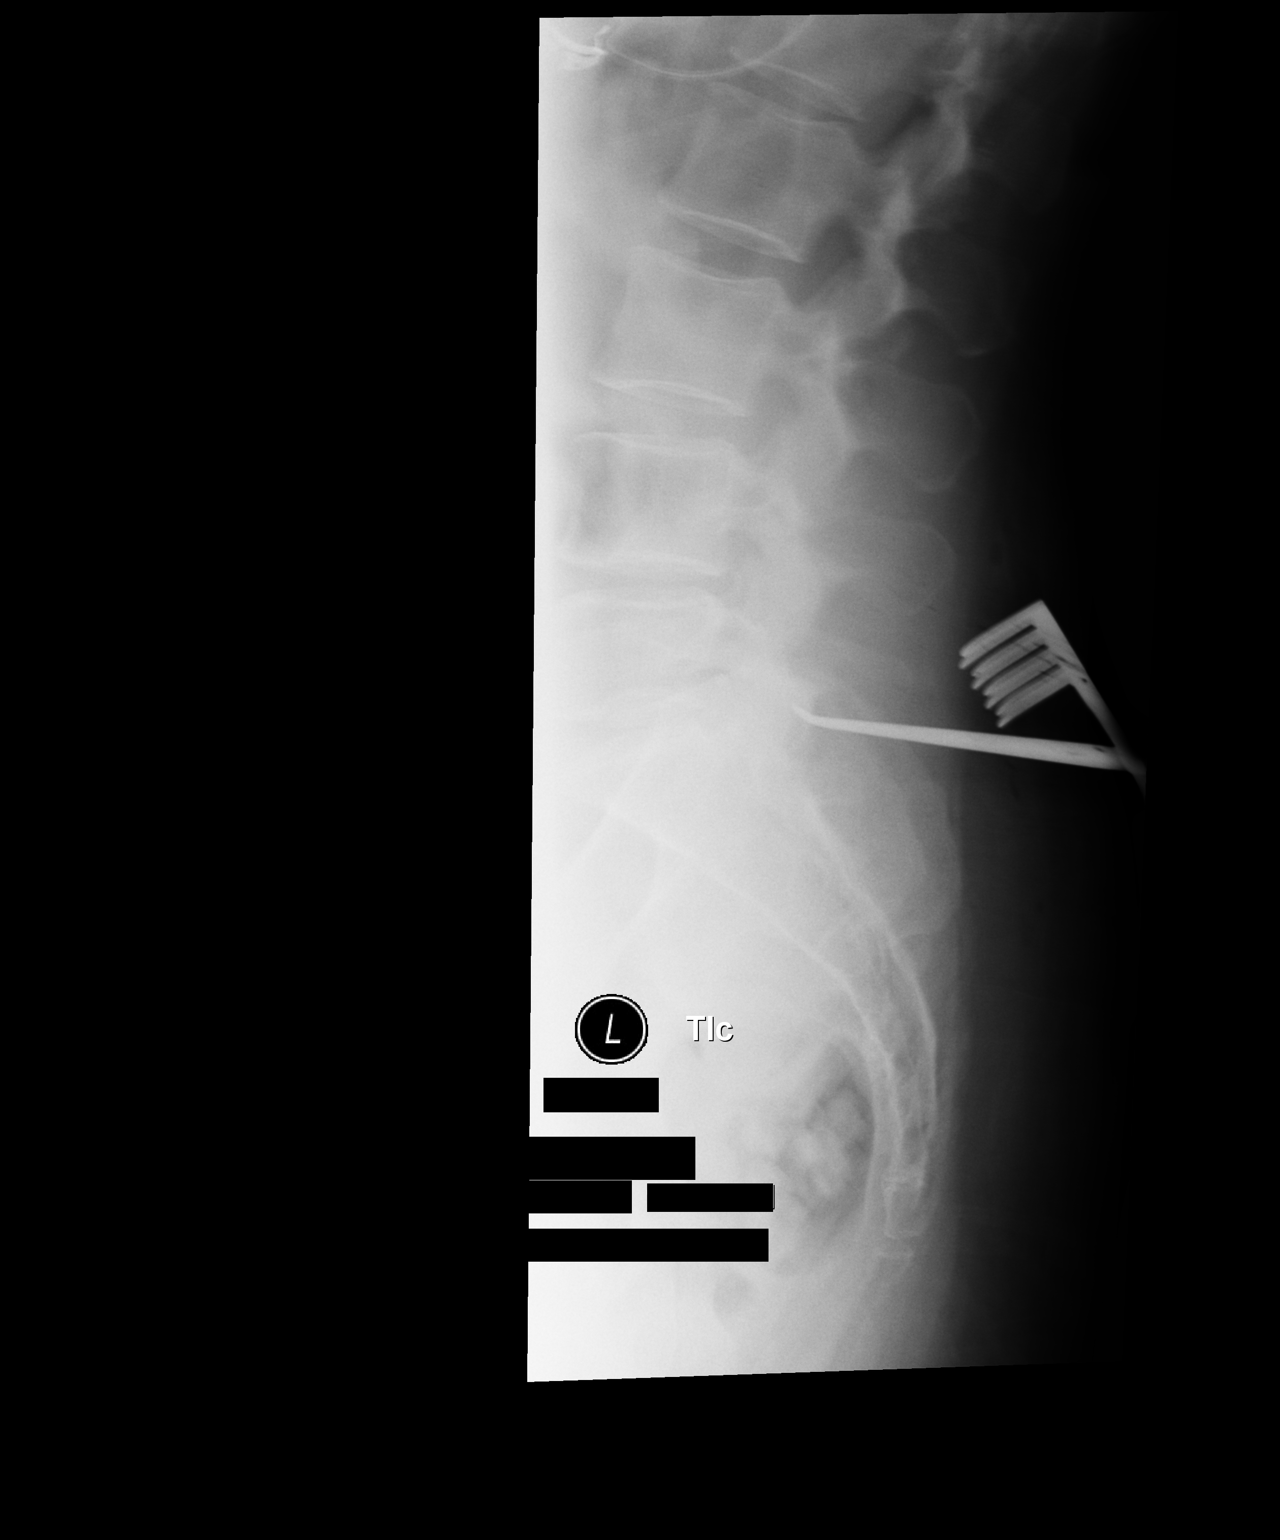

[2 of 2 positions shown; findings below may reference images not displayed]

FINDINGS: Five non-rib-bearing lumbar segments on the prior study.

Two lateral views lumbar spine obtained in the operating room.

Image number 1 demonstrates a needle in the soft tissues posterior
to the L5 spinous process. Second needle in the soft tissues
posterior to the S2 spinous process.

Image number 2 demonstrates a surgical instrument advanced to the
region of the L5 lamina directed at the L5-S1 disc space.
IMPRESSION: L5-S1 localized in the operating room.

## 2023-12-04 ENCOUNTER — Encounter (HOSPITAL_COMMUNITY): Payer: Self-pay

## 2023-12-04 ENCOUNTER — Emergency Department (HOSPITAL_COMMUNITY)
Admission: EM | Admit: 2023-12-04 | Discharge: 2023-12-04 | Disposition: A | Discharge status: Home / Self Care | Attending: Emergency Medicine | Admitting: Emergency Medicine

## 2023-12-04 ENCOUNTER — Other Ambulatory Visit: Payer: Self-pay

## 2023-12-04 DIAGNOSIS — W25XXXA Contact with sharp glass, initial encounter: Secondary | ICD-10-CM | POA: Diagnosis not present

## 2023-12-04 DIAGNOSIS — S61511A Laceration without foreign body of right wrist, initial encounter: Secondary | ICD-10-CM | POA: Insufficient documentation

## 2023-12-04 DIAGNOSIS — R451 Restlessness and agitation: Secondary | ICD-10-CM | POA: Insufficient documentation

## 2023-12-04 MED ORDER — LIDOCAINE HCL (PF) 1 % IJ SOLN
30.0000 mL | Freq: Once | INTRAMUSCULAR | Status: DC
  Filled 2023-12-04: qty 30

## 2023-12-04 NOTE — ED Notes (Addendum)
 Patient laceration has been clean

## 2023-12-04 NOTE — Discharge Instructions (Signed)
 Keep sutures clean and dry.  They will need to be removed in about 1 week or so.   Your primary care doctor or local urgent care can do this for you. Return here for any new/acute changes.
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
## 2023-12-04 NOTE — ED Notes (Signed)
Pt. Left before receiving discharge paperwork. 

## 2023-12-04 NOTE — ED Notes (Addendum)
 Pt walked out and says she is leaving because her [employer] is here waiting on her and its going to leave her if she doesn't leave now; alerted charge RN
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
